# Patient Record
Sex: Female | Born: 1970 | Race: Black or African American | Hispanic: No | Marital: Married | Smoking: Never smoker
Health system: Southern US, Community
[De-identification: ages and names within clinical notes are randomized; demographics above are authoritative.]

## PROBLEM LIST (undated history)

## (undated) DIAGNOSIS — E119 Type 2 diabetes mellitus without complications: Secondary | ICD-10-CM

## (undated) DIAGNOSIS — I10 Essential (primary) hypertension: Secondary | ICD-10-CM

## (undated) HISTORY — PX: TOTAL ABDOMINAL HYSTERECTOMY: SHX209

## (undated) HISTORY — DX: Type 2 diabetes mellitus without complications: E11.9

## (undated) HISTORY — PX: CHOLECYSTECTOMY: SHX55

## (undated) HISTORY — DX: Essential (primary) hypertension: I10

---

## 2015-02-20 ENCOUNTER — Telehealth: Payer: Self-pay | Admitting: *Deleted

## 2015-02-20 DIAGNOSIS — N83202 Unspecified ovarian cyst, left side: Secondary | ICD-10-CM

## 2015-02-20 NOTE — Telephone Encounter (Signed)
Attempted to contact patient to inform of ultrasound appointment scheduled for 9/15@ 11:00.  Pt needs to be informed of date/time and special instructions.  Front office will contact patient with date/time of appointment in the clinic.

## 2015-02-20 NOTE — Telephone Encounter (Signed)
Pt returned call to the clinic, appointment for ultrasound given 9/15 @ 11:00.  Pt verbalizes understanding.

## 2015-02-21 ENCOUNTER — Ambulatory Visit (HOSPITAL_COMMUNITY)
Admission: RE | Admit: 2015-02-21 | Discharge: 2015-02-21 | Disposition: A | Discharge status: Home / Self Care | Payer: Self-pay | Source: Ambulatory Visit | Attending: Obstetrics & Gynecology | Admitting: Obstetrics & Gynecology

## 2015-02-21 DIAGNOSIS — Z90710 Acquired absence of both cervix and uterus: Secondary | ICD-10-CM | POA: Insufficient documentation

## 2015-02-21 DIAGNOSIS — R1032 Left lower quadrant pain: Secondary | ICD-10-CM | POA: Insufficient documentation

## 2015-02-21 DIAGNOSIS — N83202 Unspecified ovarian cyst, left side: Secondary | ICD-10-CM

## 2015-02-25 ENCOUNTER — Telehealth: Payer: Self-pay | Admitting: General Practice

## 2015-02-25 NOTE — Telephone Encounter (Signed)
Per Dr Macon Large, patient needs to come in for ca-125 and needs appt asap with any surgeon. Called patient at both numbers, no answer- left message stating we are trying to reach you in regards to a couple appts, please call us back at the clinics

## 2015-02-26 ENCOUNTER — Encounter: Payer: Self-pay | Admitting: General Practice

## 2015-02-26 ENCOUNTER — Other Ambulatory Visit: Payer: Self-pay

## 2015-02-26 DIAGNOSIS — Z129 Encounter for screening for malignant neoplasm, site unspecified: Secondary | ICD-10-CM

## 2015-02-26 NOTE — Telephone Encounter (Signed)
Called patient and she states she already has those appts scheduled. Per chart review, patient's appts are: 9/22 for lab work and 10/6 for Phyllis Taylor visit. Changed Phyllis Taylor visit to 9/28 as patient needs sooner appt. Informed patient of sooner appt. Patient verbalized understanding and states she can come today for lab work. Told patient that isn't a problem, we will see her this afternoon. Patient had no questions

## 2015-02-27 ENCOUNTER — Telehealth: Payer: Self-pay | Admitting: *Deleted

## 2015-02-27 ENCOUNTER — Encounter: Payer: Self-pay | Admitting: Obstetrics & Gynecology

## 2015-02-27 DIAGNOSIS — N83209 Unspecified ovarian cyst, unspecified side: Secondary | ICD-10-CM | POA: Insufficient documentation

## 2015-02-27 DIAGNOSIS — Z90710 Acquired absence of both cervix and uterus: Secondary | ICD-10-CM | POA: Insufficient documentation

## 2015-02-27 LAB — CA 125: CA 125: 11 U/mL (ref <35)

## 2015-02-27 NOTE — Telephone Encounter (Signed)
Called Phyllis Taylor @ request of Dr. Macon Large to inform her of normal CA-125 test result.  Phyllis Taylor was also advised to keep appt with Dr. Debroah Loop as scheduled on 9/28 to discuss plan of care. Phyllis Taylor voiced understanding of information given and agreed to keep appt as scheduled.

## 2015-02-28 ENCOUNTER — Other Ambulatory Visit: Payer: Self-pay

## 2015-03-06 ENCOUNTER — Ambulatory Visit (INDEPENDENT_AMBULATORY_CARE_PROVIDER_SITE_OTHER): Payer: Self-pay | Admitting: Obstetrics & Gynecology

## 2015-03-06 ENCOUNTER — Encounter: Payer: Self-pay | Admitting: Obstetrics & Gynecology

## 2015-03-06 VITALS — BP 137/98 | HR 76 | Temp 98.2°F | Wt 195.5 lb

## 2015-03-06 DIAGNOSIS — N8320 Unspecified ovarian cysts: Secondary | ICD-10-CM

## 2015-03-06 DIAGNOSIS — N83202 Unspecified ovarian cyst, left side: Secondary | ICD-10-CM

## 2015-03-06 NOTE — Patient Instructions (Signed)

## 2015-03-06 NOTE — Progress Notes (Signed)
Patient ID: Phyllis Taylor, female   DOB: 02-Dec-1970, 44 y.o.   MRN: [id number]  Chief Complaint  Patient presents with  . surgical consult  persistent left ovarian cyst  HPI Phyllis Taylor is a 44 y.o. female.  [id number] No LMP recorded. Patient has had a hysterectomy. 12 years ago [facility] for prolapse and right ovary removed at [facility] Left ovarian cyst for last year on CT and f.u [place], LLQ pain  HPI  History reviewed. No pertinent past medical history.  Past Surgical History  Procedure Laterality Date  . Total abdominal hysterectomy      History reviewed. No pertinent family history.  Social History Social History  Substance Use Topics  . Smoking status: Never Smoker   . Smokeless tobacco: None  . Alcohol Use: None    Not on File  Current Outpatient Prescriptions  Medication Sig Dispense Refill  . hydrochlorothiazide (HYDRODIURIL) 25 MG tablet Take 25 mg by mouth daily.    Marland Kitchen losartan (COZAAR) 100 MG tablet Take 100 mg by mouth daily.    . metFORMIN (GLUCOPHAGE) 500 MG tablet Take by mouth 2 (two) times daily with a meal.     No current facility-administered medications for this visit.    Review of Systems Review of Systems  Constitutional: Negative.   Gastrointestinal: Positive for constipation. Negative for vomiting.  Genitourinary: Positive for pelvic pain. Negative for dysuria, vaginal bleeding and vaginal discharge.    Blood pressure 137/98, pulse 76, temperature 98.2 F (36.8 C), weight 195 lb 8 oz (88.678 kg).  Physical Exam Physical Exam  Constitutional: She is oriented to person, place, and time. She appears well-developed. No distress.  Pulmonary/Chest: Effort normal.  Abdominal: Soft. She exhibits no mass. There is no tenderness.  Genitourinary: Vagina normal. No vaginal discharge found.  No mass and minimal tenderness left   Musculoskeletal: Normal range of motion.  Neurological: She is alert and oriented to person, place, and time.  Skin: Skin is warm and  dry.  Psychiatric: She has a normal mood and affect. Her behavior is normal.    Data Reviewed  CLINICAL DATA: Left lower quadrant pain for 9 months. Previous hysterectomy and right salpingo-oophorectomy.  EXAM: TRANSABDOMINAL AND TRANSVAGINAL ULTRASOUND OF PELVIS  TECHNIQUE: Both transabdominal and transvaginal ultrasound examinations of the pelvis were performed. Transabdominal technique was performed for global imaging of the pelvis including uterus, ovaries, adnexal regions, and pelvic cul-de-sac. It was necessary to proceed with endovaginal exam following the transabdominal exam to visualize the adnexa and left adnexal cystic lesion.  COMPARISON: None  FINDINGS: Uterus  Measurements: Previous hysterectomy. Vaginal cuff is unremarkable in appearance.  Endometrium  Thickness: Surgically absent.  Right ovary  Measurements: Surgically absent. No adnexal mass identified.  Left ovary  Measurements: A complex cystic lesion is seen in the left adnexa measuring 6.8 x 5.7 x 6.0 cm. This lesion shows low-level internal echoes and increased through transmission. There also several thin internal septations and mural nodules. Most likely differential diagnoses include cystic ovarian neoplasm and endometrioma.  Other findings  Trace amount of free fluid.  IMPRESSION: 7 cm complex cystic lesion in left adnexa. Differential diagnosis includes cystic ovarian neoplasm and endometrioma. Consider surgical evaluation or pelvic MRI without and with contrast for further characterization.  This recommendation follows the consensus statement: Management of Asymptomatic Ovarian and Other Adnexal Cysts Imaged at [place]: Society of Radiologists in Ultrasound Consensus Conference Statement. Radiology 2010; [phone].   Electronically Signed  By: Myles Rosenthal M.D.  On: 02/21/2015 12:26  CT result at [facility]  Assessment    Left ovarian persistent mass, S/P TVH  and RSO     Plan    Needs financial assistance for laparoscopy and probable LSO. RTC 4 weeks to schedule        ARNOLD,JAMES 03/06/2015, 2:57 PM

## 2015-03-14 ENCOUNTER — Ambulatory Visit: Payer: Self-pay | Admitting: Obstetrics & Gynecology

## 2015-04-04 ENCOUNTER — Encounter: Payer: Self-pay | Admitting: Obstetrics & Gynecology

## 2015-04-04 ENCOUNTER — Ambulatory Visit (INDEPENDENT_AMBULATORY_CARE_PROVIDER_SITE_OTHER): Payer: Self-pay | Admitting: Obstetrics & Gynecology

## 2015-04-04 VITALS — BP 156/64 | HR 69 | Temp 98.7°F | Ht 64.0 in | Wt 201.5 lb

## 2015-04-04 DIAGNOSIS — N83209 Unspecified ovarian cyst, unspecified side: Secondary | ICD-10-CM

## 2015-04-04 NOTE — Patient Instructions (Signed)
Diagnostic Laparoscopy A diagnostic laparoscopy is a procedure to diagnose diseases in the abdomen. During the procedure, a thin, lighted, pencil-sized instrument called a laparoscope is inserted into the abdomen through an incision. The laparoscope allows your health care provider to look at the organs inside your body. LET YOUR HEALTH CARE PROVIDER KNOW ABOUT:  Any allergies you have.  All medicines you are taking, including vitamins, herbs, eye drops, creams, and over-the-counter medicines.  Previous problems you or members of your family have had with the use of anesthetics.  Any blood disorders you have.  Previous surgeries you have had.  Medical conditions you have. RISKS AND COMPLICATIONS  Generally, this is a safe procedure. However, problems can occur, which may include:  Infection.  Bleeding.  Damage to other organs.  Allergic reaction to the anesthetics used during the procedure. BEFORE THE PROCEDURE  Do not eat or drink anything after midnight on the night before the procedure or as directed by your health care provider.  Ask your health care provider about:  Changing or stopping your regular medicines.  Taking medicines such as aspirin and ibuprofen. These medicines can thin your blood. Do not take these medicines before your procedure if your health care provider instructs you not to.  Plan to have someone take you home after the procedure. PROCEDURE  You may be given a medicine to help you relax (sedative).  You will be given a medicine to make you sleep (general anesthetic).  Your abdomen will be inflated with a gas. This will make your organs easier to see.  Small incisions will be made in your abdomen.  A laparoscope and other small instruments will be inserted into the abdomen through the incisions.  A tissue sample may be removed from an organ in the abdomen for examination.  The instruments will be removed from the abdomen.  The gas will be  released.  The incisions will be closed with stitches (sutures). AFTER THE PROCEDURE  Your blood pressure, heart rate, breathing rate, and blood oxygen level will be monitored often until the medicines you were given have worn off.   This information is not intended to replace advice given to you by your health care provider. Make sure you discuss any questions you have with your health care provider.   Document Released: 08/31/2000 Document Revised: 02/13/2015 Document Reviewed: 01/05/2014 Elsevier Interactive Patient Education 2016 [employer]vier Inc[employer]  

## 2015-04-04 NOTE — Progress Notes (Signed)
Patient ID: Phyllis Taylor, female   DOB: 04-22-1971, 44 y.o.   MRN: [id number]  Chief Complaint  Patient presents with  . Follow-up    ovarian cyst; USon 02/21/15  still some pain, financial application denied  HPI Phyllis SaltsRegina Taylor is a 44 y.o. female.  [id number] S/P TAH, plan to schedule L/S oophorectomy for sx complex mass but no financial coverage.  HPI  Past Medical History  Diagnosis Date  . Diabetes mellitus without complication (HCC)   . Hypertension     Past Surgical History  Procedure Laterality Date  . Total abdominal hysterectomy      History reviewed. No pertinent family history.  Social History Social History  Substance Use Topics  . Smoking status: Never Smoker   . Smokeless tobacco: Never Used  . Alcohol Use: No    No Known Allergies  Current Outpatient Prescriptions  Medication Sig Dispense Refill  . hydrochlorothiazide (HYDRODIURIL) 25 MG tablet Take 25 mg by mouth daily.    Marland Kitchen. losartan (COZAAR) 100 MG tablet Take 100 mg by mouth daily.    . metFORMIN (GLUCOPHAGE) 500 MG tablet Take by mouth 2 (two) times daily with a meal.     No current facility-administered medications for this visit.    Review of Systems Review of Systems  Constitutional: Negative.   Genitourinary: Positive for pelvic pain. Negative for dysuria and vaginal discharge.    Blood pressure 156/64, pulse 69, temperature 98.7 F (37.1 C), temperature source Oral, height 5\' 4"  (1.626 m), weight 201 lb 8 oz (91.4 kg).  Physical Exam Physical Exam  Constitutional: She is oriented to person, place, and time. She appears well-developed. No distress.  Pulmonary/Chest: Effort normal.  Neurological: She is alert and oriented to person, place, and time.  Psychiatric: She has a normal mood and affect. Her behavior is normal.    Data Reviewed CLINICAL DATA: Left lower quadrant pain for 9 months. Previous hysterectomy and right salpingo-oophorectomy.  EXAM: TRANSABDOMINAL AND TRANSVAGINAL  ULTRASOUND OF PELVIS  TECHNIQUE: Both transabdominal and transvaginal ultrasound examinations of the pelvis were performed. Transabdominal technique was performed for global imaging of the pelvis including uterus, ovaries, adnexal regions, and pelvic cul-de-sac. It was necessary to proceed with endovaginal exam following the transabdominal exam to visualize the adnexa and left adnexal cystic lesion.  COMPARISON: None  FINDINGS: Uterus  Measurements: Previous hysterectomy. Vaginal cuff is unremarkable in appearance.  Endometrium  Thickness: Surgically absent.  Right ovary  Measurements: Surgically absent. No adnexal mass identified.  Left ovary  Measurements: A complex cystic lesion is seen in the left adnexa measuring 6.8 x 5.7 x 6.0 cm. This lesion shows low-level internal echoes and increased through transmission. There also several thin internal septations and mural nodules. Most likely differential diagnoses include cystic ovarian neoplasm and endometrioma.  Other findings  Trace amount of free fluid.  IMPRESSION: 7 cm complex cystic lesion in left adnexa. Differential diagnosis includes cystic ovarian neoplasm and endometrioma. Consider surgical evaluation or pelvic MRI without and with contrast for further characterization.  This recommendation follows the consensus statement: Management of Asymptomatic Ovarian and Other Adnexal Cysts Imaged at [place]: Society of Radiologists in Ultrasound Consensus Conference Statement. Radiology 2010; [phone] [id number].   Electronically Signed  By: Myles RosenthalJohn Stahl M.D.  On: 02/21/2015 12:26 CA 125 11  Assessment    Left adnexal mass    differential diagnoses include cystic ovarian neoplasm and endometrioma.   Plan    Will look into financial options, hope to schedule her surgery, will contact  her        Gavriel Holzhauer 04/04/2015, 1:25 PM

## 2016-01-13 ENCOUNTER — Encounter (HOSPITAL_BASED_OUTPATIENT_CLINIC_OR_DEPARTMENT_OTHER): Payer: Self-pay | Admitting: Emergency Medicine

## 2016-01-13 ENCOUNTER — Emergency Department (HOSPITAL_BASED_OUTPATIENT_CLINIC_OR_DEPARTMENT_OTHER)
Admission: EM | Admit: 2016-01-13 | Discharge: 2016-01-13 | Disposition: A | Discharge status: Home / Self Care | Payer: 59 | Attending: Emergency Medicine | Admitting: Emergency Medicine

## 2016-01-13 ENCOUNTER — Emergency Department (HOSPITAL_BASED_OUTPATIENT_CLINIC_OR_DEPARTMENT_OTHER): Payer: 59

## 2016-01-13 DIAGNOSIS — M25512 Pain in left shoulder: Secondary | ICD-10-CM | POA: Insufficient documentation

## 2016-01-13 DIAGNOSIS — E119 Type 2 diabetes mellitus without complications: Secondary | ICD-10-CM | POA: Insufficient documentation

## 2016-01-13 DIAGNOSIS — I10 Essential (primary) hypertension: Secondary | ICD-10-CM | POA: Diagnosis not present

## 2016-01-13 DIAGNOSIS — M25519 Pain in unspecified shoulder: Secondary | ICD-10-CM

## 2016-01-13 MED ORDER — NAPROXEN 500 MG PO TABS: 500.0000 mg | ORAL_TABLET | Freq: Two times a day (BID) | ORAL | 0 refills | Status: DC

## 2016-01-13 MED FILL — NAPROXEN 500 MG TABLET: 500 | 15 days supply | Qty: 30 | Fill #0

## 2016-01-13 NOTE — ED Triage Notes (Signed)
Patient states that she has had pain to her left shoulder x 2 months

## 2016-02-01 ENCOUNTER — Encounter (HOSPITAL_BASED_OUTPATIENT_CLINIC_OR_DEPARTMENT_OTHER): Payer: Self-pay | Admitting: *Deleted

## 2016-02-01 ENCOUNTER — Emergency Department (HOSPITAL_BASED_OUTPATIENT_CLINIC_OR_DEPARTMENT_OTHER): Payer: 59

## 2016-02-01 ENCOUNTER — Emergency Department (HOSPITAL_BASED_OUTPATIENT_CLINIC_OR_DEPARTMENT_OTHER)
Admission: EM | Admit: 2016-02-01 | Discharge: 2016-02-01 | Disposition: A | Discharge status: Home / Self Care | Payer: 59 | Attending: Emergency Medicine | Admitting: Emergency Medicine

## 2016-02-01 DIAGNOSIS — Z7984 Long term (current) use of oral hypoglycemic drugs: Secondary | ICD-10-CM | POA: Insufficient documentation

## 2016-02-01 DIAGNOSIS — R1032 Left lower quadrant pain: Secondary | ICD-10-CM | POA: Diagnosis present

## 2016-02-01 DIAGNOSIS — I10 Essential (primary) hypertension: Secondary | ICD-10-CM | POA: Insufficient documentation

## 2016-02-01 DIAGNOSIS — E119 Type 2 diabetes mellitus without complications: Secondary | ICD-10-CM | POA: Insufficient documentation

## 2016-02-01 DIAGNOSIS — Z79899 Other long term (current) drug therapy: Secondary | ICD-10-CM | POA: Insufficient documentation

## 2016-02-01 LAB — URINALYSIS, ROUTINE W REFLEX MICROSCOPIC
BILIRUBIN URINE: NEGATIVE
GLUCOSE, UA: 250 mg/dL — AB
HGB URINE DIPSTICK: NEGATIVE
KETONES UR: NEGATIVE mg/dL
Nitrite: NEGATIVE
PROTEIN: NEGATIVE mg/dL
Specific Gravity, Urine: 1.023 (ref 1.005–1.030)
pH: 6 (ref 5.0–8.0)

## 2016-02-01 LAB — URINE MICROSCOPIC-ADD ON
RBC / HPF: NONE SEEN RBC/hpf (ref 0–5)
Squamous Epithelial / LPF: NONE SEEN

## 2016-02-01 MED ORDER — KETOROLAC TROMETHAMINE 60 MG/2ML IM SOLN
60.0000 mg | Freq: Once | INTRAMUSCULAR | Status: AC
  Administered 2016-02-01: 60 mg via INTRAMUSCULAR
  Filled 2016-02-01: qty 2

## 2016-02-01 MED ORDER — KETOROLAC TROMETHAMINE 30 MG/ML IJ SOLN: 30.0000 mg | Freq: Once | INTRAMUSCULAR | Status: DC

## 2016-02-01 NOTE — ED Provider Notes (Signed)
[facility]   CSN: [id number] Arrival date & time: 02/01/16  0809  History   Chief Complaint Chief Complaint  Patient presents with  . Abdominal Pain    HPI Phyllis Taylor is a 45 y.o. female.  HPI  Patient presents with LLQ pain.   Reports intermittent sharp LLQ abdominal pain beginning last night. Has not taken anything for pain. No associated nausea or vomiting. Denies vaginal bleeding or discharge. No hematuria, dysuria, increased urinary frequency. Denies fevers, chills. Has history of complex cyst in L adnexa for which she has been followed by OB, however has not had pain for about a year, so has not been seen. Was previously scheduled to have the cyst removed, however did not have insurance at that time and pain resolved, so procedure was never performed. History of total abdominal hysterectomy with R oophorectomy, but patient thinks she still has her R ovary.   Past Medical History:  Diagnosis Date  . Diabetes mellitus without complication (HCC)   . Hypertension     Patient Active Problem List   Diagnosis Date Noted  . Complex left ovarian cyst 02/27/2015  . S/P hysterectomy 02/27/2015    Past Surgical History:  Procedure Laterality Date  . CHOLECYSTECTOMY    . TOTAL ABDOMINAL HYSTERECTOMY      OB History    Gravida Para Term Preterm AB Living   5 4 4  0 1 4   SAB TAB Ectopic Multiple Live Births   0 0 1 0         Home Medications    Prior to Admission medications   Medication Sig Start Date End Date Taking? Authorizing Provider  hydrochlorothiazide (HYDRODIURIL) 25 MG tablet Take 25 mg by mouth daily.   Yes Historical Provider, MD  losartan (COZAAR) 100 MG tablet Take 100 mg by mouth daily.   Yes Historical Provider, MD  metFORMIN (GLUCOPHAGE) 500 MG tablet Take by mouth 2 (two) times daily with a meal.   Yes Historical Provider, MD  naproxen (NAPROSYN) 500 MG tablet Take 1 tablet (500 mg total) by mouth 2 (two) times daily with  a meal. 01/13/16  Yes Arthor CaptainAbigail Harris, PA-C  PRESCRIPTION MEDICATION Chloresterol  Medication   Yes Historical Provider, MD    Family History No family history on file.  Social History Social History  Substance Use Topics  . Smoking status: Never Smoker  . Smokeless tobacco: Never Used  . Alcohol use No     Allergies   Review of patient's allergies indicates no known allergies.   Review of Systems Review of Systems  Constitutional: Negative for appetite change, chills and fever.  Respiratory: Negative for shortness of breath.   Cardiovascular: Negative for chest pain.  Gastrointestinal: Positive for abdominal pain. Negative for constipation, diarrhea, nausea and vomiting.  Genitourinary: Negative for dysuria, frequency, hematuria, vaginal bleeding, vaginal discharge and vaginal pain.  Allergic/Immunologic: Negative for immunocompromised state.    Physical Exam Updated Vital Signs BP 137/91 (BP Location: Left Leg)   Pulse 69   Temp 97.3 F (36.3 C) (Oral)   Resp 20   Ht 5\' 5"  (1.651 m)   Wt 88.5 kg   SpO2 100%   BMI 32.45 kg/m   Physical Exam  Constitutional: She is oriented to person, place, and time.  Pleasant, obese female sitting up in bed in NAD  HENT:  Head: Normocephalic and atraumatic.  Right Ear: External ear normal.  Left Ear: External ear normal.  Nose: Nose normal.  Mouth/Throat: Oropharynx is clear and moist. No oropharyngeal exudate.  Eyes: Conjunctivae and EOM are normal. Pupils are equal, round, and reactive to light. Right eye exhibits no discharge. Left eye exhibits no discharge.  Cardiovascular: Normal rate, regular rhythm and normal heart sounds.   No murmur heard. Pulmonary/Chest: Effort normal and breath sounds normal. No respiratory distress. She has no wheezes.  Abdominal: Soft. Bowel sounds are normal. She exhibits no distension and no mass. There is tenderness (to deep palpation of LLQ). There is no rebound and no guarding.  Neurological:  She is alert and oriented to person, place, and time.  Skin: Skin is warm and dry.  Psychiatric: She has a normal mood and affect. Her behavior is normal.   ED Treatments / Results  Labs (all labs ordered are listed, but only abnormal results are displayed) Labs Reviewed  URINALYSIS, ROUTINE W REFLEX MICROSCOPIC (NOT AT [facility]) - Abnormal; Notable for the following:       Result Value   Glucose, UA 250 (*)    Leukocytes, UA MODERATE (*)    All other components within normal limits  URINE MICROSCOPIC-ADD ON - Abnormal; Notable for the following:    Bacteria, UA FEW (*)    All other components within normal limits    EKG  EKG Interpretation None       Radiology US Transvaginal Non-ob  Result Date: 02/01/2016 CLINICAL DATA:  Left lower quadrant pain for 24 hours. Prior hysterectomy. Prior right oophorectomy. History of ovarian cysts on the left. EXAM: TRANSABDOMINAL AND TRANSVAGINAL ULTRASOUND OF PELVIS DOPPLER ULTRASOUND OF OVARIES TECHNIQUE: Both transabdominal and transvaginal ultrasound examinations of the pelvis were performed. Transabdominal technique was performed for global imaging of the pelvis including uterus, ovaries, adnexal regions, and pelvic cul-de-sac. It was necessary to proceed with endovaginal exam following the transabdominal exam to visualize the left ovary. Color and duplex Doppler ultrasound was utilized to evaluate blood flow to the ovaries. COMPARISON:  02/21/2015 FINDINGS: Uterus Measurements: Prior hysterectomy. No central pelvic mass. Endometrium Thickness: N/A. Right ovary Measurements: Prior oophorectomy. No adnexal mass. Left ovary Measurements: 4.6 x 2.3 x 2.1 cm. Two anechoic areas, the largest measuring 1.9 cm. The previously seen complex 7 cm cystic mass no longer visualized. These likely represent small follicles. Pulsed Doppler evaluation of both ovaries demonstrates no free fluid. Other findings No abnormal free fluid. IMPRESSION: Small follicles in the  left ovary. No acute findings in the pelvis. Electronically Signed   By: Charlett Nose M.D.   On: 02/01/2016 09:52   US Pelvis Complete  Result Date: 02/01/2016 CLINICAL DATA:  Left lower quadrant pain for 24 hours. Prior hysterectomy. Prior right oophorectomy. History of ovarian cysts on the left. EXAM: TRANSABDOMINAL AND TRANSVAGINAL ULTRASOUND OF PELVIS DOPPLER ULTRASOUND OF OVARIES TECHNIQUE: Both transabdominal and transvaginal ultrasound examinations of the pelvis were performed. Transabdominal technique was performed for global imaging of the pelvis including uterus, ovaries, adnexal regions, and pelvic cul-de-sac. It was necessary to proceed with endovaginal exam following the transabdominal exam to visualize the left ovary. Color and duplex Doppler ultrasound was utilized to evaluate blood flow to the ovaries. COMPARISON:  02/21/2015 FINDINGS: Uterus Measurements: Prior hysterectomy. No central pelvic mass. Endometrium Thickness: N/A. Right ovary Measurements: Prior oophorectomy. No adnexal mass. Left ovary Measurements: 4.6 x 2.3 x 2.1 cm. Two anechoic areas, the largest measuring 1.9 cm. The previously seen complex 7 cm cystic mass no longer visualized. These likely represent small follicles. Pulsed Doppler evaluation of both ovaries demonstrates no  free fluid. Other findings No abnormal free fluid. IMPRESSION: Small follicles in the left ovary. No acute findings in the pelvis. Electronically Signed   By: Charlett Nose M.D.   On: 02/01/2016 09:52   [place] Art/ven Flow Abd Pelv Doppler  Result Date: 02/01/2016 CLINICAL DATA:  Left lower quadrant pain for 24 hours. Prior hysterectomy. Prior right oophorectomy. History of ovarian cysts on the left. EXAM: TRANSABDOMINAL AND TRANSVAGINAL ULTRASOUND OF PELVIS DOPPLER ULTRASOUND OF OVARIES TECHNIQUE: Both transabdominal and transvaginal ultrasound examinations of the pelvis were performed. Transabdominal technique was performed for global imaging of the pelvis  including uterus, ovaries, adnexal regions, and pelvic cul-de-sac. It was necessary to proceed with endovaginal exam following the transabdominal exam to visualize the left ovary. Color and duplex Doppler ultrasound was utilized to evaluate blood flow to the ovaries. COMPARISON:  02/21/2015 FINDINGS: Uterus Measurements: Prior hysterectomy. No central pelvic mass. Endometrium Thickness: N/A. Right ovary Measurements: Prior oophorectomy. No adnexal mass. Left ovary Measurements: 4.6 x 2.3 x 2.1 cm. Two anechoic areas, the largest measuring 1.9 cm. The previously seen complex 7 cm cystic mass no longer visualized. These likely represent small follicles. Pulsed Doppler evaluation of both ovaries demonstrates no free fluid. Other findings No abnormal free fluid. IMPRESSION: Small follicles in the left ovary. No acute findings in the pelvis. Electronically Signed   By: Charlett Nose M.D.   On: 02/01/2016 09:52    Procedures Procedures (including critical care time)  Medications Ordered in ED Medications  ketorolac (TORADOL) injection 60 mg (60 mg Intramuscular Given 02/01/16 0952)   Initial Impression / Assessment and Plan / ED Course  I have reviewed the triage vital signs and the nursing notes.  Pertinent labs & imaging results that were available during my care of the patient were reviewed by me and considered in my medical decision making (see chart for details).  Clinical Course   [phone] As patient still has ovary on L at location of pain, concern for possible ovarian torsion. Will obtain transvaginal ultrasound to rule out. Toradol given for pain.   Final Clinical Impressions(s) / ED Diagnoses   Final diagnoses:  LLQ abdominal pain  LLQ abdominal pain  LLQ abdominal pain   Patient presenting with LLQ pain. Pelvic US ruled out ovarian torsion, but did show L ovarian follicles. Pain improved with Toradol in ED. Instructed patient to call gynecologist's office Monday to schedule appointment, and  taken ibuprofen for pain.   New Prescriptions New Prescriptions   No medications on file     Marquette Saa, MD 02/01/16 1038    Gwyneth Sprout, MD 02/01/16 1234

## 2016-02-01 NOTE — ED Triage Notes (Signed)
Patient states she developed llq abdominal pain yesterday.  Describes pain as stabbing and throbbing.  States she has a history of ovarian cysts.

## 2016-02-01 NOTE — Discharge Instructions (Signed)
For your abdominal pain, you can take 600-800 mg ibuprofen every 6 hours as needed.   Please call your gynecologist (Dr. Debroah LoopArnold) office on Monday to schedule an appointment.

## 2016-02-08 NOTE — ED Provider Notes (Signed)
[facility]-EMERGENCY DEPT Provider Note   CSN: [id number] Arrival date & time: 01/13/16  1303     History   Chief Complaint Chief Complaint  Patient presents with  . Shoulder Pain    HPI Phyllis Taylor is a 45 y.o. female.  Shoulder Pain Patient complains of left shoulder pain. The symptoms began 2 months ago. Aggravating factors: no known event. Pain is located in the Left glenohumeral region. Discomfort is described as aching. Symptoms are exacerbated by overhead movements and lying on the shoulder. Evaluation to date: none. Therapy to date includes: nothing specific.    The history is provided by the patient.  Shoulder Pain   Pertinent negatives include no numbness.    Past Medical History:  Diagnosis Date  . Diabetes mellitus without complication (HCC)   . Hypertension     Patient Active Problem List   Diagnosis Date Noted  . Complex left ovarian cyst 02/27/2015  . S/P hysterectomy 02/27/2015    Past Surgical History:  Procedure Laterality Date  . CHOLECYSTECTOMY    . TOTAL ABDOMINAL HYSTERECTOMY      OB History    Gravida Para Term Preterm AB Living   5 4 4  0 1 4   SAB TAB Ectopic Multiple Live Births   0 0 1 0         Home Medications    Prior to Admission medications   Medication Sig Start Date End Date Taking? Authorizing Provider  hydrochlorothiazide (HYDRODIURIL) 25 MG tablet Take 25 mg by mouth daily.    Historical Provider, MD  losartan (COZAAR) 100 MG tablet Take 100 mg by mouth daily.    Historical Provider, MD  metFORMIN (GLUCOPHAGE) 500 MG tablet Take by mouth 2 (two) times daily with a meal.    Historical Provider, MD  naproxen (NAPROSYN) 500 MG tablet Take 1 tablet (500 mg total) by mouth 2 (two) times daily with a meal. 01/13/16   Arthor Captain, PA-C  PRESCRIPTION MEDICATION Chloresterol  Medication    Historical Provider, MD    Family History History reviewed. No pertinent family history.  Social History Social History  Substance Use  Topics  . Smoking status: Never Smoker  . Smokeless tobacco: Never Used  . Alcohol use No     Allergies   Review of patient's allergies indicates no known allergies.   Review of Systems Review of Systems  Musculoskeletal: Positive for arthralgias (L shoulder).  Neurological: Negative for weakness and numbness.     Physical Exam Updated Vital Signs BP 134/75 (BP Location: Right Arm)   Pulse 66   Temp 98.7 F (37.1 C) (Oral)   Resp 16   Ht 5\' 5"  (1.651 m)   Wt 89.8 kg   SpO2 100%   BMI 32.95 kg/m   Physical Exam  Constitutional: She is oriented to person, place, and time. She appears well-developed and well-nourished. No distress.  HENT:  Head: Normocephalic and atraumatic.  Eyes: Conjunctivae are normal. No scleral icterus.  Neck: Normal range of motion.  Cardiovascular: Normal rate, regular rhythm and normal heart sounds.  Exam reveals no gallop and no friction rub.   No murmur heard. Pulmonary/Chest: Effort normal and breath sounds normal. No respiratory distress.  Abdominal: Soft. Bowel sounds are normal. She exhibits no distension and no mass. There is no tenderness. There is no guarding.  Musculoskeletal:  Shoulder exam: reduced range of motion of left GH joint, positive impingement signs, remainder of shoulder exam is normal, ipsilateral elbow, wrist and hand exam  is normal, contralateral shoulder exam is normal. NVI   Neurological: She is alert and oriented to person, place, and time.  Skin: Skin is warm and dry. She is not diaphoretic.  Nursing note and vitals reviewed.    ED Treatments / Results  Labs (all labs ordered are listed, but only abnormal results are displayed) Labs Reviewed - No data to display  EKG  EKG Interpretation None       Radiology No results found. CLINICAL DATA: Left shoulder pain for 2 months    EXAM:  LEFT SHOULDER - 2+ VIEW    COMPARISON: None.    FINDINGS:  Three views of the left shoulder submitted. No  acute fracture or  subluxation. No radiopaque foreign body.    IMPRESSION:  Negative.      Electronically Signed  By: Natasha MeadLiviu Pop M.D.  On: 01/13/2016 13:43      Procedures Procedures (including critical care time)  Medications Ordered in ED Medications - No data to display   Initial Impression / Assessment and Plan / ED Course  I have reviewed the triage vital signs and the nursing notes.  Pertinent labs & imaging results that were available during my care of the patient were reviewed by me and considered in my medical decision making (see chart for details).  Clinical Course    Patient X-Ray negative for obvious fracture or dislocation. Pain managed in ED. Pt advised to follow up with orthopedics if symptoms persist for possibility of missed fracture diagnosis. Patient given brace while in ED, conservative therapy recommended and discussed. Patient will be dc home & is agreeable with above plan.   Final Clinical Impressions(s) / ED Diagnoses   Final diagnoses:  Shoulder pain, unspecified laterality    New Prescriptions Discharge Medication List as of 01/13/2016  3:05 PM    START taking these medications   Details  naproxen (NAPROSYN) 500 MG tablet Take 1 tablet (500 mg total) by mouth 2 (two) times daily with a meal., Starting Mon 01/13/2016, Print         [place] MaravillasAbigail Clorine Swing, PA-C 02/10/16 [id number]    Geoffery Lyonsouglas Delo, MD 02/12/16 [phone]

## 2016-03-16 ENCOUNTER — Ambulatory Visit: Payer: 59 | Admitting: Obstetrics & Gynecology

## 2016-04-09 ENCOUNTER — Ambulatory Visit: Payer: 59 | Admitting: Obstetrics & Gynecology

## 2017-10-30 ENCOUNTER — Other Ambulatory Visit: Payer: Self-pay

## 2017-10-30 ENCOUNTER — Emergency Department (HOSPITAL_BASED_OUTPATIENT_CLINIC_OR_DEPARTMENT_OTHER)
Admission: EM | Admit: 2017-10-30 | Discharge: 2017-10-30 | Disposition: A | Discharge status: Home / Self Care | Payer: 59 | Attending: Emergency Medicine | Admitting: Emergency Medicine

## 2017-10-30 ENCOUNTER — Encounter (HOSPITAL_BASED_OUTPATIENT_CLINIC_OR_DEPARTMENT_OTHER): Payer: Self-pay | Admitting: Emergency Medicine

## 2017-10-30 DIAGNOSIS — Z760 Encounter for issue of repeat prescription: Secondary | ICD-10-CM | POA: Insufficient documentation

## 2017-10-30 DIAGNOSIS — E119 Type 2 diabetes mellitus without complications: Secondary | ICD-10-CM | POA: Insufficient documentation

## 2017-10-30 DIAGNOSIS — M5431 Sciatica, right side: Secondary | ICD-10-CM

## 2017-10-30 DIAGNOSIS — Z7984 Long term (current) use of oral hypoglycemic drugs: Secondary | ICD-10-CM | POA: Insufficient documentation

## 2017-10-30 DIAGNOSIS — Z79899 Other long term (current) drug therapy: Secondary | ICD-10-CM | POA: Insufficient documentation

## 2017-10-30 DIAGNOSIS — M5441 Lumbago with sciatica, right side: Secondary | ICD-10-CM | POA: Insufficient documentation

## 2017-10-30 DIAGNOSIS — I10 Essential (primary) hypertension: Secondary | ICD-10-CM | POA: Insufficient documentation

## 2017-10-30 DIAGNOSIS — E1165 Type 2 diabetes mellitus with hyperglycemia: Secondary | ICD-10-CM

## 2017-10-30 LAB — CBG MONITORING, ED: GLUCOSE-CAPILLARY: 334 mg/dL — AB (ref 65–99)

## 2017-10-30 MED ORDER — HYDROCHLOROTHIAZIDE 25 MG PO TABS: 25.0000 mg | ORAL_TABLET | Freq: Every day | ORAL | 0 refills | Status: AC

## 2017-10-30 MED ORDER — DIAZEPAM 5 MG PO TABS: 5.0000 mg | ORAL_TABLET | Freq: Two times a day (BID) | ORAL | 0 refills | Status: AC

## 2017-10-30 MED ORDER — LOSARTAN POTASSIUM 100 MG PO TABS: 100.0000 mg | ORAL_TABLET | Freq: Every day | ORAL | 0 refills | Status: AC

## 2017-10-30 MED ORDER — METFORMIN HCL 500 MG PO TABS
500.0000 mg | ORAL_TABLET | Freq: Once | ORAL | Status: AC
  Administered 2017-10-30: 500 mg via ORAL
  Filled 2017-10-30: qty 1

## 2017-10-30 MED ORDER — ONDANSETRON 4 MG PO TBDP
4.0000 mg | ORAL_TABLET | Freq: Once | ORAL | Status: AC
  Administered 2017-10-30: 4 mg via ORAL
  Filled 2017-10-30: qty 1

## 2017-10-30 MED ORDER — NAPROXEN 500 MG PO TABS: 500.0000 mg | ORAL_TABLET | Freq: Two times a day (BID) | ORAL | 0 refills | Status: AC

## 2017-10-30 MED ORDER — MORPHINE SULFATE (PF) 4 MG/ML IV SOLN
4.0000 mg | Freq: Once | INTRAVENOUS | Status: AC
  Administered 2017-10-30: 4 mg via INTRAMUSCULAR
  Filled 2017-10-30: qty 1

## 2017-10-30 MED ORDER — METFORMIN HCL 500 MG PO TABS: 500.0000 mg | ORAL_TABLET | Freq: Two times a day (BID) | ORAL | 0 refills | Status: AC

## 2017-10-30 NOTE — ED Triage Notes (Signed)
Patient states that she has had pain in the right side of her back since yesterday when she turned to throw something out  - patient states that she is having some numbness to her foot  - patient denies any N/V

## 2017-10-30 NOTE — ED Provider Notes (Signed)
[facility] EMERGENCY DEPARTMENT Provider Note   CSN: [id number] Arrival date & time: 10/30/17  1617     History   Chief Complaint Chief Complaint  Patient presents with  . Back Pain    HPI Florella Mcneese is a 47 y.o. female.  Please see my complete note on this patient from the same day and time.     Past Medical History:  Diagnosis Date  . Diabetes mellitus without complication (HCC)   . Hypertension     Patient Active Problem List   Diagnosis Date Noted  . Complex left ovarian cyst 02/27/2015  . S/P hysterectomy 02/27/2015    Past Surgical History:  Procedure Laterality Date  . CHOLECYSTECTOMY    . TOTAL ABDOMINAL HYSTERECTOMY       OB History    Gravida  5   Para  4   Term  4   Preterm  0   AB  1   Living  4     SAB  0   TAB  0   Ectopic  1   Multiple  0   Live Births               Home Medications    Prior to Admission medications   Medication Sig Start Date End Date Taking? Authorizing Provider  hydrochlorothiazide (HYDRODIURIL) 25 MG tablet Take 25 mg by mouth daily.    [provider]  losartan (COZAAR) 100 MG tablet Take 100 mg by mouth daily.    [provider]  metFORMIN (GLUCOPHAGE) 500 MG tablet Take by mouth 2 (two) times daily with a meal.    [provider]  naproxen (NAPROSYN) 500 MG tablet Take 1 tablet (500 mg total) by mouth 2 (two) times daily with a meal. 01/13/16   Arthor Captain, PA-C  PRESCRIPTION MEDICATION Chloresterol  Medication    [provider]    Family History History reviewed. No pertinent family history.  Social History Social History   Tobacco Use  . Smoking status: Never Smoker  . Smokeless tobacco: Never Used  Substance Use Topics  . Alcohol use: No    Alcohol/week: 0.0 oz  . Drug use: No     Allergies   Patient has no known allergies.   Review of Systems Review of Systems   Physical Exam Updated Vital Signs BP (!) 178/87 (BP  Location: Left Arm)   Pulse 88   Temp 98.4 F (36.9 C) (Oral)   Resp 20   Ht  (1.651 m)   Wt 89.4 kg (197 lb)   SpO2 98%   BMI 32.78 kg/m   Physical Exam   ED Treatments / Results  Labs (all labs ordered are listed, but only abnormal results are displayed) Labs Reviewed  CBG MONITORING, ED - Abnormal; Notable for the following components:      Result Value   Glucose-Capillary 334 (*)    All other components within normal limits    EKG None  Radiology No results found.  Procedures Procedures (including critical care time)  Medications Ordered in ED Medications  morphine 4 MG/ML injection 4 mg (has no administration in time range)  ondansetron (ZOFRAN-ODT) disintegrating tablet 4 mg (has no administration in time range)     Initial Impression / Assessment and Plan / ED Course  I have reviewed the triage vital signs and the nursing notes.  Pertinent labs & imaging results that were available during my care of the patient were reviewed by  me and considered in my medical decision making (see chart for details).       Final Clinical Impressions(s) / ED Diagnoses   Final diagnoses:  None    ED Discharge Orders    None       Jacalyn Lefevre, MD 11/05/17 [phone]

## 2017-10-30 NOTE — ED Provider Notes (Signed)
[facility] EMERGENCY DEPARTMENT Provider Note   CSN: [id number] Arrival date & time: 10/30/17  1617     History   Chief Complaint Chief Complaint  Patient presents with  . Back Pain    HPI Phyllis Taylor is a 47 y.o. female.  Pt presents to the ED to the ED today with back pain.  Pt said she twisted yesterday and felt pain to the right side of her back with numbness to her right lateral thigh.  She has no trouble with her bowel or bladder.  She is able to ambulate.  Of note, she lost her insurance and has been out of her diabetic and hypertensive meds.     Past Medical History:  Diagnosis Date  . Diabetes mellitus without complication (HCC)   . Hypertension     Patient Active Problem List   Diagnosis Date Noted  . Complex left ovarian cyst 02/27/2015  . S/P hysterectomy 02/27/2015    Past Surgical History:  Procedure Laterality Date  . CHOLECYSTECTOMY    . TOTAL ABDOMINAL HYSTERECTOMY       OB History    Gravida  5   Para  4   Term  4   Preterm  0   AB  1   Living  4     SAB  0   TAB  0   Ectopic  1   Multiple  0   Live Births               Home Medications    Prior to Admission medications   Medication Sig Start Date End Date Taking? Authorizing Provider  diazepam (VALIUM) 5 MG tablet Take 1 tablet (5 mg total) by mouth 2 (two) times daily. 10/30/17   Jacalyn Lefevre, MD  hydrochlorothiazide (HYDRODIURIL) 25 MG tablet Take 1 tablet (25 mg total) by mouth daily. 10/30/17   Jacalyn Lefevre, MD  losartan (COZAAR) 100 MG tablet Take 1 tablet (100 mg total) by mouth daily. 10/30/17   Jacalyn Lefevre, MD  metFORMIN (GLUCOPHAGE) 500 MG tablet Take 1 tablet (500 mg total) by mouth 2 (two) times daily with a meal. 10/30/17   Jacalyn Lefevre, MD  naproxen (NAPROSYN) 500 MG tablet Take 1 tablet (500 mg total) by mouth 2 (two) times daily with a meal. 10/30/17   Jacalyn Lefevre, MD  PRESCRIPTION MEDICATION Chloresterol  Medication    [provider]    Family History History reviewed. No pertinent family history.  Social History Social History   Tobacco Use  . Smoking status: Never Smoker  . Smokeless tobacco: Never Used  Substance Use Topics  . Alcohol use: No    Alcohol/week: 0.0 oz  . Drug use: No     Allergies   Patient has no known allergies.   Review of Systems Review of Systems  Musculoskeletal: Positive for back pain.  Neurological: Positive for numbness.  All other systems reviewed and are negative.    Physical Exam Updated Vital Signs BP (!) 178/87 (BP Location: Left Arm)   Pulse 88   Temp 98.4 F (36.9 C) (Oral)   Resp 20   Ht  (1.651 m)   Wt 89.4 kg (197 lb)   SpO2 98%   BMI 32.78 kg/m   Physical Exam  Constitutional: She is oriented to person, place, and time. She appears well-developed and well-nourished.  HENT:  Head: Normocephalic and atraumatic.  Right Ear: External ear normal.  Left Ear: External ear normal.  Nose:  Nose normal.  Mouth/Throat: Oropharynx is clear and moist.  Eyes: Pupils are equal, round, and reactive to light. Conjunctivae and EOM are normal.  Neck: Normal range of motion. Neck supple.  Cardiovascular: Normal rate, regular rhythm, normal heart sounds and intact distal pulses.  Pulmonary/Chest: Effort normal and breath sounds normal.  Abdominal: Soft. Bowel sounds are normal.  Neurological: She is alert and oriented to person, place, and time.  + straight leg raise on right  Skin: Capillary refill takes less than 2 seconds.  Psychiatric: She has a normal mood and affect. Her behavior is normal. Judgment and thought content normal.  Nursing note and vitals reviewed.    ED Treatments / Results  Labs (all labs ordered are listed, but only abnormal results are displayed) Labs Reviewed  CBG MONITORING, ED - Abnormal; Notable for the following components:      Result Value   Glucose-Capillary 334 (*)    All other components within normal  limits    EKG None  Radiology No results found.  Procedures Procedures (including critical care time)  Medications Ordered in ED Medications  morphine 4 MG/ML injection 4 mg (4 mg Intramuscular Given 10/30/17 1741)  ondansetron (ZOFRAN-ODT) disintegrating tablet 4 mg (4 mg Oral Given 10/30/17 1744)  metFORMIN (GLUCOPHAGE) tablet 500 mg (500 mg Oral Given 10/30/17 1743)     Initial Impression / Assessment and Plan / ED Course  I have reviewed the triage vital signs and the nursing notes.  Pertinent labs & imaging results that were available during my care of the patient were reviewed by me and considered in my medical decision making (see chart for details).     As pt's blood sugar is poorly controlled already, I won't give her decadron or a prednisone taper.  Pt's meds will be refilled today.  Pt knows to return if worse. F/u with pcp.  Final Clinical Impressions(s) / ED Diagnoses   Final diagnoses:  Sciatica of right side  Poorly controlled type 2 diabetes mellitus (HCC)  Medication refill    ED Discharge Orders        Ordered    hydrochlorothiazide (HYDRODIURIL) 25 MG tablet  Daily     10/30/17 1816    losartan (COZAAR) 100 MG tablet  Daily     10/30/17 1816    metFORMIN (GLUCOPHAGE) 500 MG tablet  2 times daily with meals     10/30/17 1816    naproxen (NAPROSYN) 500 MG tablet  2 times daily with meals     10/30/17 1816    diazepam (VALIUM) 5 MG tablet  2 times daily     10/30/17 1816       Jacalyn Lefevre, MD 10/30/17 [phone]

## 2018-01-29 IMAGING — US US ART/VEN ABD/PELV/SCROTUM DOPPLER LTD
1 series · 13 of 25 positions shown · non-contrast
Comparison: 02/21/2015

CLINICAL DATA: Left lower quadrant pain for 24 hours. Prior
hysterectomy. Prior right oophorectomy. History of ovarian cysts on
the left.

EXAM:
TRANSABDOMINAL AND TRANSVAGINAL ULTRASOUND OF PELVIS
DOPPLER ULTRASOUND OF OVARIES
TECHNIQUE: Both transabdominal and transvaginal ultrasound examinations of the
pelvis were performed. Transabdominal technique was performed for
global imaging of the pelvis including uterus, ovaries, adnexal
regions, and pelvic cul-de-sac.
It was necessary to proceed with endovaginal exam following the
transabdominal exam to visualize the left ovary. Color and duplex
Doppler ultrasound was utilized to evaluate blood flow to the
ovaries.

[Series 1: us art/ven abd/pelv/scrotum doppler ltd · 0.22mm/px · 13 of 74 slices shown]
[im 1/74]
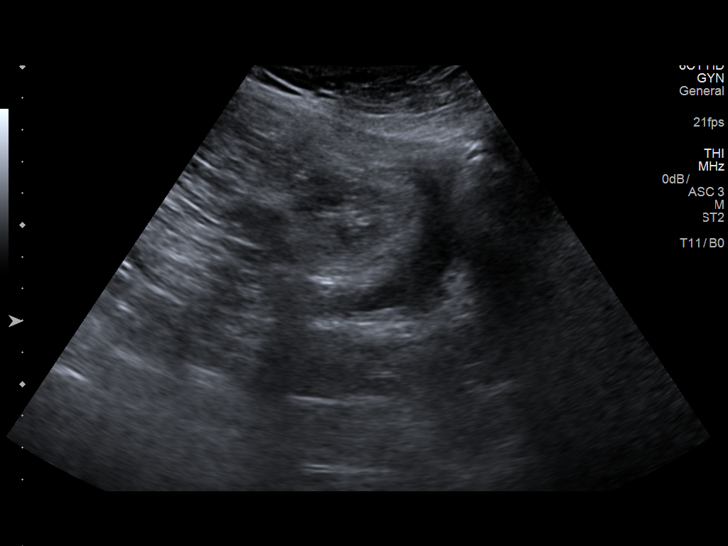
[im 7/74]
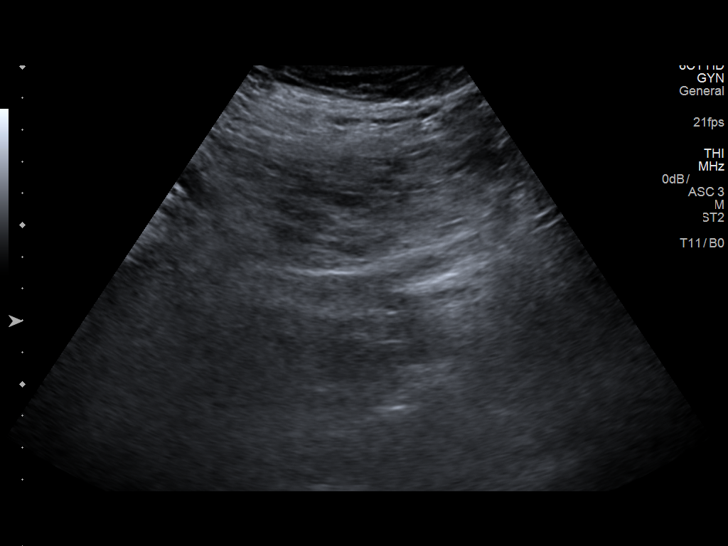
[im 13/74]
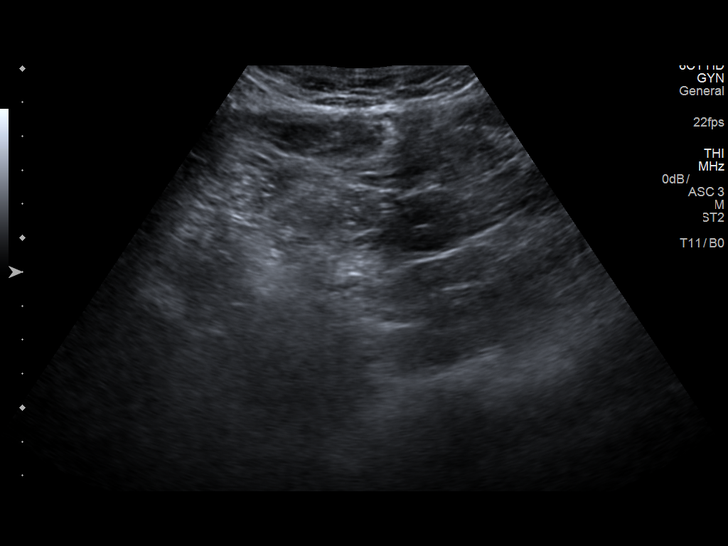
[im 19/74]
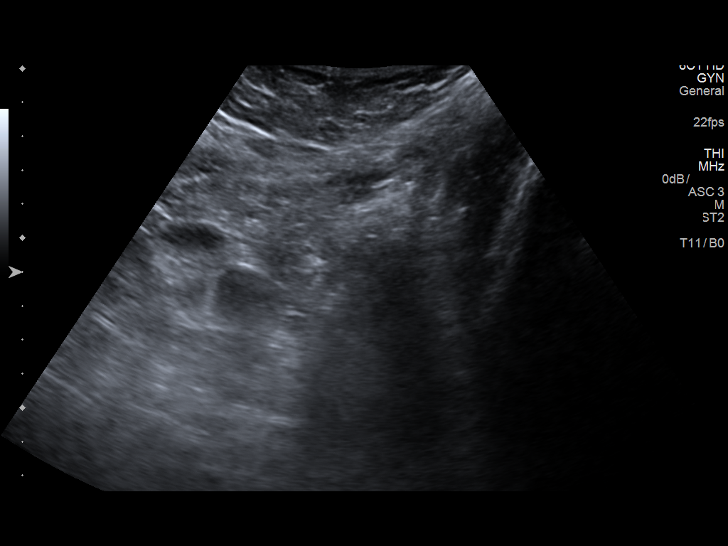
[im 25/74]
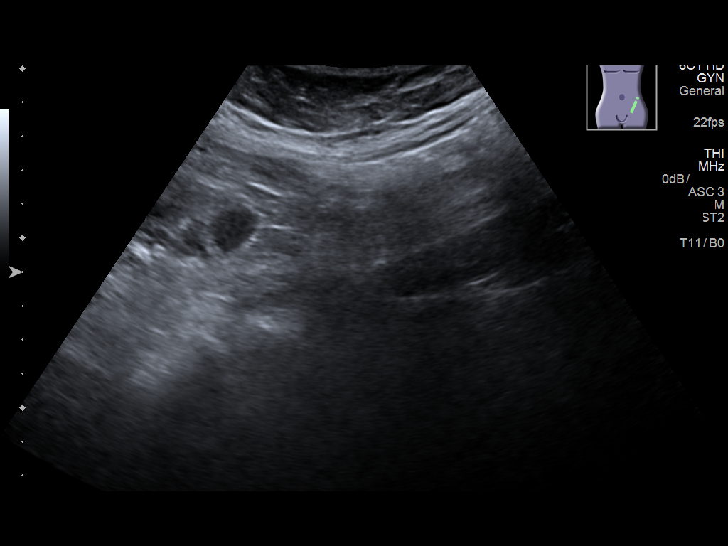
[im 31/74]
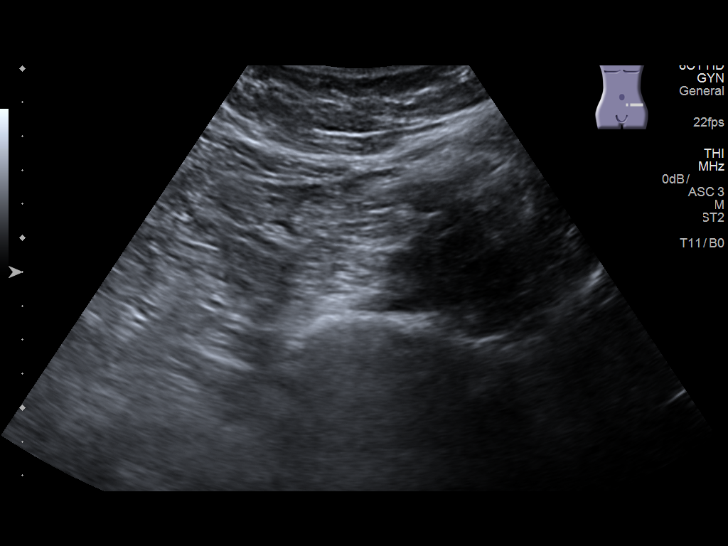
[im 37/74]
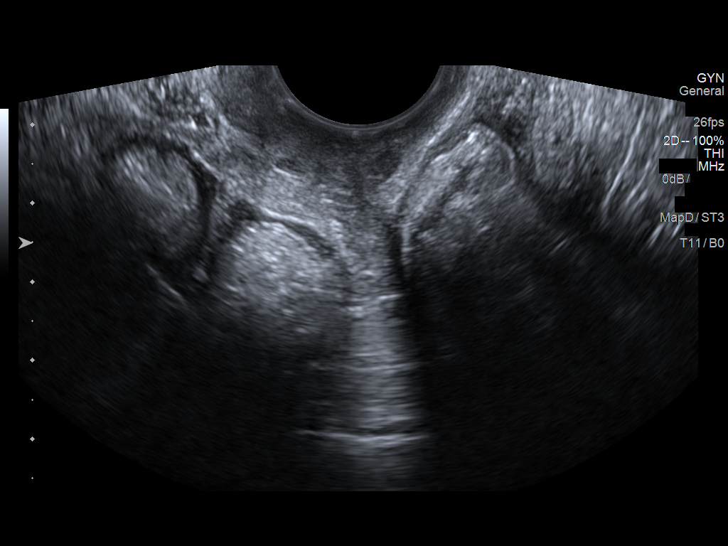
[im 43/74]
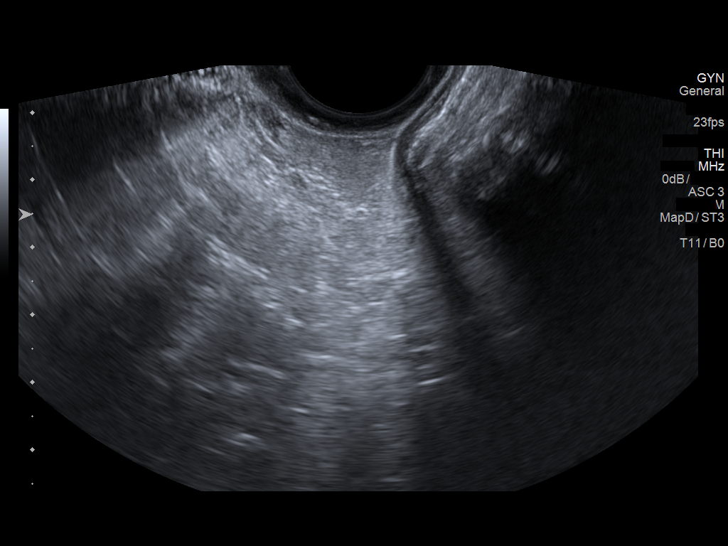
[im 49/74]
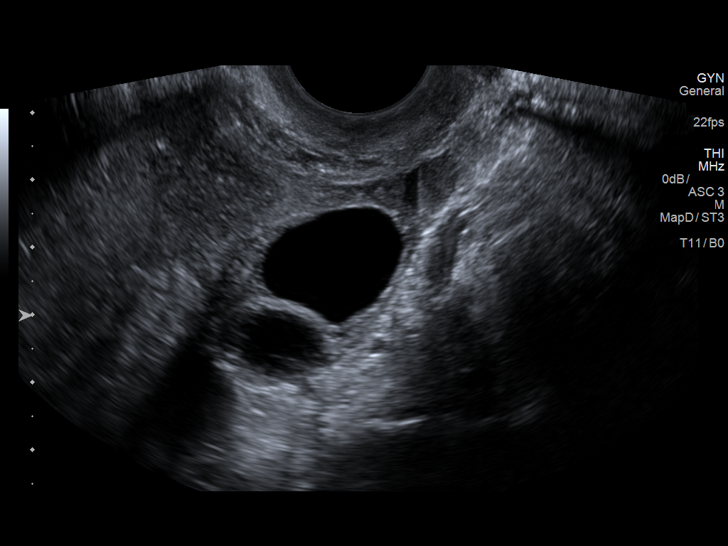
[im 55/74]
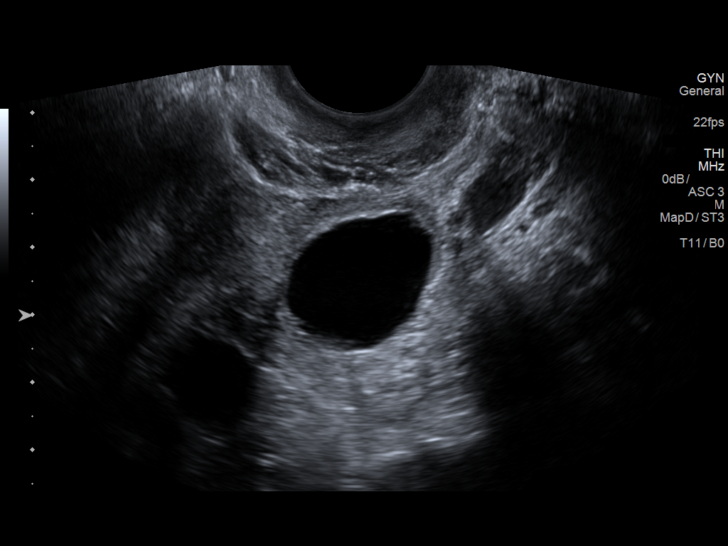
[im 61/74]
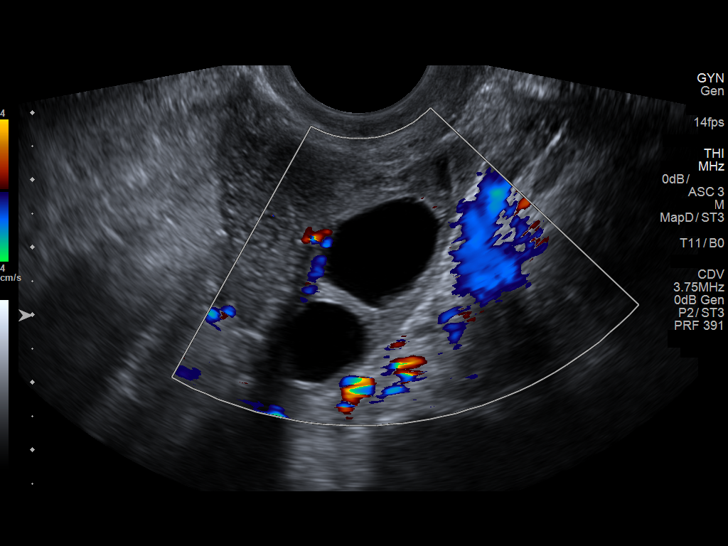
[im 67/74]
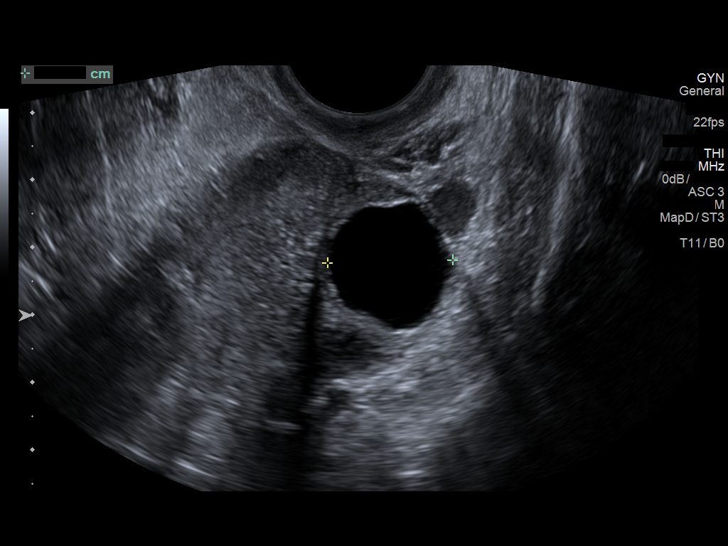
[im 74/74]
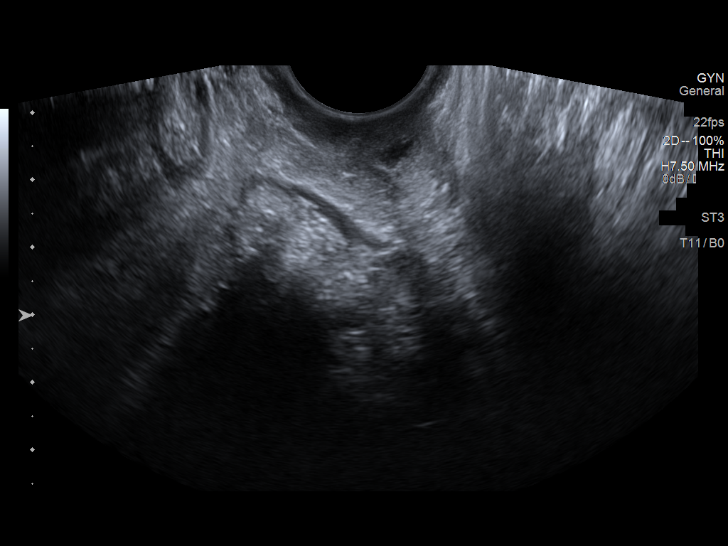

[13 of 25 positions shown; findings below may reference images not displayed]

FINDINGS: Uterus

Measurements: Prior hysterectomy. No central pelvic mass.

Endometrium

Thickness: N/A.

Right ovary

Measurements: Prior oophorectomy. No adnexal mass.

Left ovary

Measurements: 4.6 x 2.3 x 2.1 cm. Two anechoic areas, the largest
measuring 1.9 cm. The previously seen complex 7 cm cystic mass no
longer visualized. These likely represent small follicles.

Pulsed Doppler evaluation of both ovaries demonstrates no free
fluid.

Other findings

No abnormal free fluid.
IMPRESSION: Small follicles in the left ovary.

No acute findings in the pelvis.
